# Patient Record
Sex: Male | Born: 1985 | Race: Black or African American | Hispanic: No | Marital: Single | Smoking: Never smoker
Health system: Southern US, Community
[De-identification: ages and names within clinical notes are randomized; demographics above are authoritative.]

## PROBLEM LIST (undated history)

## (undated) DIAGNOSIS — K5790 Diverticulosis of intestine, part unspecified, without perforation or abscess without bleeding: Secondary | ICD-10-CM

## (undated) DIAGNOSIS — K5792 Diverticulitis of intestine, part unspecified, without perforation or abscess without bleeding: Secondary | ICD-10-CM

## (undated) HISTORY — DX: Diverticulitis of intestine, part unspecified, without perforation or abscess without bleeding: K57.92

## (undated) HISTORY — DX: Diverticulosis of intestine, part unspecified, without perforation or abscess without bleeding: K57.90

---

## 2019-07-27 ENCOUNTER — Other Ambulatory Visit: Payer: Self-pay

## 2019-07-27 ENCOUNTER — Encounter (HOSPITAL_BASED_OUTPATIENT_CLINIC_OR_DEPARTMENT_OTHER): Payer: Self-pay | Admitting: *Deleted

## 2019-07-27 ENCOUNTER — Inpatient Hospital Stay (HOSPITAL_BASED_OUTPATIENT_CLINIC_OR_DEPARTMENT_OTHER)
Admission: EM | Admit: 2019-07-27 | Discharge: 2019-07-29 | DRG: 392 | Disposition: A | Discharge status: Home / Self Care | Payer: BC Managed Care – PPO | Source: Ambulatory Visit | Attending: Physician Assistant | Admitting: Physician Assistant

## 2019-07-27 ENCOUNTER — Emergency Department (HOSPITAL_BASED_OUTPATIENT_CLINIC_OR_DEPARTMENT_OTHER): Payer: BC Managed Care – PPO

## 2019-07-27 DIAGNOSIS — K5792 Diverticulitis of intestine, part unspecified, without perforation or abscess without bleeding: Secondary | ICD-10-CM | POA: Diagnosis present

## 2019-07-27 DIAGNOSIS — K8020 Calculus of gallbladder without cholecystitis without obstruction: Secondary | ICD-10-CM | POA: Diagnosis present

## 2019-07-27 DIAGNOSIS — K5732 Diverticulitis of large intestine without perforation or abscess without bleeding: Secondary | ICD-10-CM | POA: Diagnosis not present

## 2019-07-27 DIAGNOSIS — I493 Ventricular premature depolarization: Secondary | ICD-10-CM | POA: Diagnosis present

## 2019-07-27 DIAGNOSIS — Z20822 Contact with and (suspected) exposure to covid-19: Secondary | ICD-10-CM | POA: Diagnosis not present

## 2019-07-27 DIAGNOSIS — R1031 Right lower quadrant pain: Secondary | ICD-10-CM | POA: Diagnosis not present

## 2019-07-27 LAB — CBC WITH DIFFERENTIAL/PLATELET
Abs Immature Granulocytes: 0.03 10*3/uL (ref 0.00–0.07)
Basophils Absolute: 0 10*3/uL (ref 0.0–0.1)
Basophils Relative: 0 %
Eosinophils Absolute: 0 10*3/uL (ref 0.0–0.5)
Eosinophils Relative: 0 %
HCT: 44.1 % (ref 39.0–52.0)
Hemoglobin: 14.5 g/dL (ref 13.0–17.0)
Immature Granulocytes: 0 %
Lymphocytes Relative: 13 %
Lymphs Abs: 1.4 10*3/uL (ref 0.7–4.0)
MCH: 28.7 pg (ref 26.0–34.0)
MCHC: 32.9 g/dL (ref 30.0–36.0)
MCV: 87.3 fL (ref 80.0–100.0)
Monocytes Absolute: 0.9 10*3/uL (ref 0.1–1.0)
Monocytes Relative: 9 %
Neutro Abs: 8.4 10*3/uL — ABNORMAL HIGH (ref 1.7–7.7)
Neutrophils Relative %: 78 %
Platelets: 243 10*3/uL (ref 150–400)
RBC: 5.05 MIL/uL (ref 4.22–5.81)
RDW: 11.9 % (ref 11.5–15.5)
WBC: 10.8 10*3/uL — ABNORMAL HIGH (ref 4.0–10.5)
nRBC: 0 % (ref 0.0–0.2)

## 2019-07-27 LAB — LIPASE, BLOOD: Lipase: 21 U/L (ref 11–51)

## 2019-07-27 LAB — RESPIRATORY PANEL BY RT PCR (FLU A&B, COVID)
Influenza A by PCR: NEGATIVE
Influenza B by PCR: NEGATIVE
SARS Coronavirus 2 by RT PCR: NEGATIVE

## 2019-07-27 LAB — COMPREHENSIVE METABOLIC PANEL
ALT: 37 U/L (ref 0–44)
AST: 26 U/L (ref 15–41)
Albumin: 4.7 g/dL (ref 3.5–5.0)
Alkaline Phosphatase: 43 U/L (ref 38–126)
Anion gap: 11 (ref 5–15)
BUN: 9 mg/dL (ref 6–20)
CO2: 24 mmol/L (ref 22–32)
Calcium: 9.3 mg/dL (ref 8.9–10.3)
Chloride: 102 mmol/L (ref 98–111)
Creatinine, Ser: 0.92 mg/dL (ref 0.61–1.24)
GFR calc Af Amer: >60 mL/min (ref >60)
GFR calc non Af Amer: >60 mL/min (ref >60)
Glucose, Bld: 112 mg/dL — ABNORMAL HIGH (ref 70–99)
Potassium: 3.3 mmol/L — ABNORMAL LOW (ref 3.5–5.1)
Sodium: 137 mmol/L (ref 135–145)
Total Bilirubin: 2.6 mg/dL — ABNORMAL HIGH (ref 0.3–1.2)
Total Protein: 8 g/dL (ref 6.5–8.1)

## 2019-07-27 LAB — URINALYSIS, ROUTINE W REFLEX MICROSCOPIC
Bilirubin Urine: NEGATIVE
Glucose, UA: NEGATIVE mg/dL
Hgb urine dipstick: NEGATIVE
Ketones, ur: NEGATIVE mg/dL
Leukocytes,Ua: NEGATIVE
Nitrite: NEGATIVE
Protein, ur: NEGATIVE mg/dL
Specific Gravity, Urine: 1.02 (ref 1.005–1.030)
pH: 6 (ref 5.0–8.0)

## 2019-07-27 LAB — LACTIC ACID, PLASMA: Lactic Acid, Venous: 0.9 mmol/L (ref 0.5–1.9)

## 2019-07-27 MED ORDER — OXYCODONE HCL 5 MG PO TABS: 5.0000 mg | ORAL_TABLET | Freq: Four times a day (QID) | ORAL | Status: DC | PRN

## 2019-07-27 MED ORDER — ACETAMINOPHEN 500 MG PO TABS
1000.0000 mg | ORAL_TABLET | Freq: Four times a day (QID) | ORAL | Status: DC
  Administered 2019-07-27 – 2019-07-29 (×8): 1000 mg via ORAL
  Filled 2019-07-27 (×8): qty 2

## 2019-07-27 MED ORDER — MORPHINE SULFATE (PF) 4 MG/ML IV SOLN
4.0000 mg | Freq: Once | INTRAVENOUS | Status: AC
  Administered 2019-07-27: 4 mg via INTRAVENOUS
  Filled 2019-07-27: qty 1

## 2019-07-27 MED ORDER — ONDANSETRON 4 MG PO TBDP: 4.0000 mg | ORAL_TABLET | Freq: Four times a day (QID) | ORAL | Status: DC | PRN

## 2019-07-27 MED ORDER — DIPHENHYDRAMINE HCL 25 MG PO CAPS: 25.0000 mg | ORAL_CAPSULE | Freq: Four times a day (QID) | ORAL | Status: DC | PRN

## 2019-07-27 MED ORDER — KETOROLAC TROMETHAMINE 15 MG/ML IJ SOLN: 15.0000 mg | Freq: Four times a day (QID) | INTRAMUSCULAR | Status: DC | PRN

## 2019-07-27 MED ORDER — SODIUM CHLORIDE 0.9 % IV BOLUS (SEPSIS)
1000.0000 mL | Freq: Once | INTRAVENOUS | Status: AC
  Administered 2019-07-27: 1000 mL via INTRAVENOUS

## 2019-07-27 MED ORDER — HYDRALAZINE HCL 20 MG/ML IJ SOLN: 10.0000 mg | INTRAMUSCULAR | Status: DC | PRN

## 2019-07-27 MED ORDER — ENOXAPARIN SODIUM 40 MG/0.4ML ~~LOC~~ SOLN
40.0000 mg | SUBCUTANEOUS | Status: DC
  Administered 2019-07-27 – 2019-07-28 (×2): 40 mg via SUBCUTANEOUS
  Filled 2019-07-27 (×2): qty 0.4

## 2019-07-27 MED ORDER — IOHEXOL 300 MG/ML  SOLN
100.0000 mL | Freq: Once | INTRAMUSCULAR | Status: AC | PRN
  Administered 2019-07-27: 100 mL via INTRAVENOUS

## 2019-07-27 MED ORDER — SODIUM CHLORIDE 0.9 % IV SOLN: INTRAVENOUS | Status: DC

## 2019-07-27 MED ORDER — DIPHENHYDRAMINE HCL 50 MG/ML IJ SOLN: 25.0000 mg | Freq: Four times a day (QID) | INTRAMUSCULAR | Status: DC | PRN

## 2019-07-27 MED ORDER — PIPERACILLIN-TAZOBACTAM 3.375 G IVPB
3.3750 g | Freq: Three times a day (TID) | INTRAVENOUS | Status: DC
  Administered 2019-07-28 – 2019-07-29 (×4): 3.375 g via INTRAVENOUS
  Filled 2019-07-27 (×4): qty 50

## 2019-07-27 MED ORDER — IBUPROFEN 400 MG PO TABS: 600.0000 mg | ORAL_TABLET | Freq: Four times a day (QID) | ORAL | Status: DC | PRN

## 2019-07-27 MED ORDER — PIPERACILLIN-TAZOBACTAM 3.375 G IVPB
3.3750 g | Freq: Three times a day (TID) | INTRAVENOUS | Status: DC
  Administered 2019-07-27: 3.375 g via INTRAVENOUS
  Filled 2019-07-27: qty 50

## 2019-07-27 MED ORDER — SODIUM CHLORIDE 0.9 % IV BOLUS
1000.0000 mL | Freq: Once | INTRAVENOUS | Status: AC
  Administered 2019-07-27: 1000 mL via INTRAVENOUS

## 2019-07-27 MED ORDER — TRAMADOL HCL 50 MG PO TABS: 50.0000 mg | ORAL_TABLET | Freq: Four times a day (QID) | ORAL | Status: DC | PRN

## 2019-07-27 MED ORDER — PANTOPRAZOLE SODIUM 40 MG IV SOLR
40.0000 mg | Freq: Every day | INTRAVENOUS | Status: DC
  Administered 2019-07-28 (×2): 40 mg via INTRAVENOUS
  Filled 2019-07-27 (×2): qty 40

## 2019-07-27 MED ORDER — METHOCARBAMOL 500 MG PO TABS: 500.0000 mg | ORAL_TABLET | Freq: Four times a day (QID) | ORAL | Status: DC | PRN

## 2019-07-27 MED ORDER — METOPROLOL TARTRATE 5 MG/5ML IV SOLN: 5.0000 mg | Freq: Four times a day (QID) | INTRAVENOUS | Status: DC | PRN

## 2019-07-27 MED ORDER — ACETAMINOPHEN 325 MG PO TABS
650.0000 mg | ORAL_TABLET | Freq: Once | ORAL | Status: AC
  Administered 2019-07-27: 12:00:00 650 mg via ORAL
  Filled 2019-07-27: qty 2

## 2019-07-27 MED ORDER — HYDROMORPHONE HCL 1 MG/ML IJ SOLN: 0.5000 mg | INTRAMUSCULAR | Status: DC | PRN

## 2019-07-27 MED ORDER — PIPERACILLIN-TAZOBACTAM 3.375 G IVPB 30 MIN
3.3750 g | Freq: Once | INTRAVENOUS | Status: AC
  Administered 2019-07-27: 3.375 g via INTRAVENOUS
  Filled 2019-07-27 (×2): qty 50

## 2019-07-27 MED ORDER — ONDANSETRON HCL 4 MG/2ML IJ SOLN: 4.0000 mg | Freq: Four times a day (QID) | INTRAMUSCULAR | Status: DC | PRN

## 2019-07-27 NOTE — Plan of Care (Signed)

## 2019-07-27 NOTE — ED Notes (Signed)
Spoke with Ruby at [facility] regarding general surgery consult

## 2019-07-27 NOTE — H&P (Addendum)
Surgical H&P  Chief Complaint: Abdominal pain  HPI: Otherwise healthy 34 year old gentleman who presented to [facility] this afternoon with a complaint of lower abdominal pain.  This began yesterday afternoon and is localized to the lower abdomen on both sides, crampy in quality, moderate intensity and without any clear aggravating or alleviating factors.  Does not radiate.  He tried Gas-X which did not relieve his symptoms.  He has never had any symptoms like this.  He reports no significant history of constipation or irregular bowel habits.  No family history of GI problems to his knowledge.  Never had any nausea or vomiting associated with this.  No fevers or chills.  Last bowel movement was this morning and was normal.  On presentation he was tachycardic to the 120s but afebrile.  He was found to have a minimal leukocytosis of 10.8, and elevated bilirubin of 2.6 and otherwise normal labs.  Heart rate improved with pain medication and fluids.  CT scan performed which showed acute diverticulitis with probable contained tiny perforation, at the splenic flexure.  Surgical service was contacted for admission.  He works in Engineer, technical sales at [employer]  Non-smoker.  No Known Allergies  History reviewed. No pertinent past medical history.  History reviewed. No pertinent surgical history.  History reviewed. No pertinent family history.  Social History   Socioeconomic History  . Marital status: Single    Spouse name: Not on file  . Number of children: Not on file  . Years of education: Not on file  . Highest education level: Not on file  Occupational History  . Not on file  Tobacco Use  . Smoking status: Never Smoker  Substance and Sexual Activity  . Alcohol use: Not Currently  . Drug use: Never  . Sexual activity: Yes  Other Topics Concern  . Not on file  Social History Narrative  . Not on file   Social Determinants of Health   Financial Resource Strain:   .  Difficulty of Paying Living Expenses: Not on file  Food Insecurity:   . Worried About Charity fundraiser in the Last Year: Not on file  . Ran Out of Food in the Last Year: Not on file  Transportation Needs:   . Lack of Transportation (Medical): Not on file  . Lack of Transportation (Non-Medical): Not on file  Physical Activity:   . Days of Exercise per Week: Not on file  . Minutes of Exercise per Session: Not on file  Stress:   . Feeling of Stress : Not on file  Social Connections:   . Frequency of Communication with Friends and Family: Not on file  . Frequency of Social Gatherings with Friends and Family: Not on file  . Attends Religious Services: Not on file  . Active Member of Clubs or Organizations: Not on file  . Attends Archivist Meetings: Not on file  . Marital Status: Not on file    No current facility-administered medications on file prior to encounter.   Current Outpatient Medications on File Prior to Encounter  Medication Sig Dispense Refill  . ibuprofen (ADVIL) 200 MG tablet Take 200 mg by mouth every 6 (six) hours as needed for headache or moderate pain.    Marland Kitchen TADALAFIL PO Take 6-12 mg by mouth daily as needed (sexual activity).      Review of Systems: a complete, 10pt review of systems was completed with pertinent positives and negatives as documented in the HPI  Physical  Exam: Vitals:   07/27/19 1409 07/27/19 1519  BP: (!) 167/110 (!) 163/102  Pulse: (!) 112 (!) 109  Resp: (!) 27 17  Temp:  98.2 F (36.8 C)  SpO2: 99% 98%   Gen: A&Ox3, no distress  Eyes: lids and conjunctivae normal, no icterus. Pupils equally round and reactive to light.  Respiratory: supple without mass or thyromegaly Chest: respiratory effort is normal. No crepitus or tenderness on palpation of the chest. Breath sounds equal.  Cardiovascular: RRR with palpable distal pulses, no pedal edema Gastrointestinal: soft, nondistended, minimally tender in the left mid abdomen. No  mass, hepatomegaly or splenomegaly. No hernia. Muscoloskeletal: no clubbing or cyanosis of the fingers.  Strength is symmetrical throughout.  Range of motion of bilateral upper and lower extremities normal with pain, crepitation or contracture. Neuro: cranial nerves grossly intact.  Sensation intact to light touch diffusely. Psych: appropriate mood and affect, normal insight/judgment intact  Skin: warm and dry   CBC Latest Ref Rng & Units 07/27/2019  WBC 4.0 - 10.5 K/uL 10.8(H)  Hemoglobin 13.0 - 17.0 g/dL 33.8  Hematocrit 25.0 - 52.0 % 44.1  Platelets 150 - 400 K/uL 243    CMP Latest Ref Rng & Units 07/27/2019  Glucose 70 - 99 mg/dL 539(J)  BUN 6 - 20 mg/dL 9  Creatinine 6.73 - 4.19 mg/dL 3.79  Sodium 024 - 097 mmol/L 137  Potassium 3.5 - 5.1 mmol/L 3.3(L)  Chloride 98 - 111 mmol/L 102  CO2 22 - 32 mmol/L 24  Calcium 8.9 - 10.3 mg/dL 9.3  Total Protein 6.5 - 8.1 g/dL 8.0  Total Bilirubin 0.3 - 1.2 mg/dL 2.6(H)  Alkaline Phos 38 - 126 U/L 43  AST 15 - 41 U/L 26  ALT 0 - 44 U/L 37    No results found for: INR, PROTIME  Imaging: CT Abdomen Pelvis W Contrast  Result Date: 07/27/2019 CLINICAL DATA:  Generalized abdominal pain. EXAM: CT ABDOMEN AND PELVIS WITH CONTRAST TECHNIQUE: Multidetector CT imaging of the abdomen and pelvis was performed using the standard protocol following bolus administration of intravenous contrast. CONTRAST:  OMNIPAQUE IOHEXOL 300 MG/ML  SOLN COMPARISON:  None. FINDINGS: Lower chest: No acute abnormality. Hepatobiliary: Tiny hypodensity in the right hepatic lobe (series 2, image 28) is too small to fully characterize but likely represents a small cyst. There is a tiny gallstone in the gallbladder neck. No gallbladder wall thickening, or biliary dilatation. Pancreas: Unremarkable. No pancreatic ductal dilatation or surrounding inflammatory changes. Spleen: Normal in size without focal abnormality. Adrenals/Urinary Tract: Adrenal glands are unremarkable. No  renal calculi or hydronephrosis. Tiny hypodensities in the bilateral kidneys are too small to fully characterize but likely represent small cysts. Bladder is unremarkable. Stomach/Bowel: Stomach is within normal limits. Appendix appears normal. There are numerous colonic diverticula in the sigmoid and descending colon. There is bowel wall thickening and pericolonic fat stranding in the left upper quadrant at the junction between the transverse and descending colon. Probable tiny contained perforation measuring 1.2 cm (series 5, image 44). No distant free air. Vascular/Lymphatic: No significant vascular findings are present. There are some mildly enlarged likely reactive lymph nodes in the left upper quadrant for example a lymph node measuring 1.0 cm in short axis (series 2, image 43). Reproductive: Prostate is unremarkable. Other: No abdominal wall hernia or abnormality. Trace free fluid in the pelvis. Musculoskeletal: Small benign appearing lesion with sclerotic rim in the right iliac bone (series 2, image 77). IMPRESSION: 1. Acute diverticulitis at the splenic  flexure in the left upper quadrant with probable tiny contained perforation. No distant free air. Mildly enlarged likely reactive lymph nodes in the left central abdomen. 2. Cholelithiasis without evidence of cholecystitis. Electronically Signed   By: Emmaline Kluver M.D.   On: 07/27/2019 13:03     A/P: 34 year old male with acute diverticulitis at the splenic flexure with a probable tiny contained perforation.  Admit for bowel rest, fluid resuscitation, IV antibiotics.  Serial abdominal exams.  We discussed the etiology and anatomy of diverticulitis and possible spectrum of disease, emphasizing that his symptoms are anticipated to improve with medical treatment and would recommend a colonoscopy in about 6 weeks.  We also discussed there is a small possibility that the small perforation can evolve into an abscess or free perforation which may require  drain placement or emergency surgery which can require colostomy.  Questions welcomed and answered. Elevated Tbili- check LFTs in AM  ADMISSION STATUS: Observation  Patient Active Problem List   Diagnosis Date Noted  . Diverticulitis 07/27/2019       Phylliss Blakes, MD [facility] [place] [facility]  See AMION to contact appropriate on-call provider

## 2019-07-27 NOTE — ED Provider Notes (Addendum)
[facility] EMERGENCY DEPARTMENT Provider Note   CSN: [id number] Arrival date & time: 07/27/19  1056     History Chief Complaint  Patient presents with  . Abdominal Pain    Ricky Robles. is a 34 y.o. male otherwise healthy no daily medication use presents today from an urgent care for complaint of lower abdominal pain.  Patient reports that he was sent for "a CT scan".  Patient reports that yesterday afternoon he developed lower abdominal pain, he describes a bilateral lower abdominal pain crampy moderate intensity nonradiating no clear aggravating or alleviating factors.  He reports that he took Gas-X without relief of symptoms.  He denies history of similar in the past.  Denies headache, vision changes, neck pain, chest pain/shortness of breath, cough, upper abdominal pain, nausea/vomiting, diarrhea, constipation, blood in the stool, dysuria/hematuria, testicular pain/swelling, penile discharge, fall/injury, extremity swelling/color change or any additional concerns.  Of note patient reports last bowel movement was this morning and was "normal".  HPI     History reviewed. No pertinent past medical history.  Patient Active Problem List   Diagnosis Date Noted  . Diverticulitis 07/27/2019    History reviewed. No pertinent surgical history.     History reviewed. No pertinent family history.  Social History   Tobacco Use  . Smoking status: Never Smoker  Substance Use Topics  . Alcohol use: Not Currently  . Drug use: Never    Home Medications Prior to Admission medications   Not on File    Allergies    Patient has no known allergies.  Review of Systems   Review of Systems Ten systems are reviewed and are negative for acute change except as noted in the HPI  Physical Exam Updated Vital Signs BP (!) 167/110 (BP Location: Right Arm)   Pulse (!) 112   Temp 99.4 F (37.4 C) (Oral)   Resp (!) 27   Ht 6\' 1"  (1.854 m)   Wt 113.4 kg   SpO2 99%    BMI 32.98 kg/m   Physical Exam Constitutional:      General: He is not in acute distress.    Appearance: Normal appearance. He is well-developed. He is not ill-appearing or diaphoretic.  HENT:     Head: Normocephalic and atraumatic.     Right Ear: External ear normal.     Left Ear: External ear normal.     Nose: Nose normal.  Eyes:     General: Vision grossly intact. Gaze aligned appropriately.     Pupils: Pupils are equal, round, and reactive to light.  Neck:     Trachea: Trachea and phonation normal. No tracheal deviation.  Cardiovascular:     Rate and Rhythm: Regular rhythm. Tachycardia present.  Pulmonary:     Effort: Pulmonary effort is normal. No respiratory distress.  Abdominal:     General: There is no distension.     Palpations: Abdomen is soft.     Tenderness: There is abdominal tenderness in the right lower quadrant, suprapubic area and left lower quadrant. There is guarding. There is no rebound.  Genitourinary:    Comments: Deferred by patient Musculoskeletal:        General: Normal range of motion.     Cervical back: Normal range of motion.  Skin:    General: Skin is warm and dry.  Neurological:     Mental Status: He is alert.     GCS: GCS eye subscore is 4. GCS verbal subscore is 5. GCS motor subscore  is 6.     Comments: Speech is clear and goal oriented, follows commands Major Cranial nerves without deficit, no facial droop Moves extremities without ataxia, coordination intact  Psychiatric:        Behavior: Behavior normal.     ED Results / Procedures / Treatments   Labs (all labs ordered are listed, but only abnormal results are displayed) Labs Reviewed  CBC WITH DIFFERENTIAL/PLATELET - Abnormal; Notable for the following components:      Result Value   WBC 10.8 (*)    Neutro Abs 8.4 (*)    All other components within normal limits  COMPREHENSIVE METABOLIC PANEL - Abnormal; Notable for the following components:   Potassium 3.3 (*)    Glucose, Bld  112 (*)    Total Bilirubin 2.6 (*)    All other components within normal limits  RESPIRATORY PANEL BY RT PCR (FLU A&B, COVID)  CULTURE, BLOOD (ROUTINE X 2)  CULTURE, BLOOD (ROUTINE X 2)  LIPASE, BLOOD  URINALYSIS, ROUTINE W REFLEX MICROSCOPIC  LACTIC ACID, PLASMA  LACTIC ACID, PLASMA    EKG EKG Interpretation  Date/Time:  Sunday July 27 2019 11:55:09 EST Ventricular Rate:  116 PR Interval:    QRS Duration: 93 QT Interval:  301 QTC Calculation: 419 R Axis:   80 Text Interpretation: Sinus tachycardia Ventricular premature complex Borderline T abnormalities, inferior leads No prior ECG for comparison . No STEMI Confirmed by Theda Belfast ([id number]) on 07/27/2019 2:27:01 PM   Radiology CT Abdomen Pelvis W Contrast  Result Date: 07/27/2019 CLINICAL DATA:  Generalized abdominal pain. EXAM: CT ABDOMEN AND PELVIS WITH CONTRAST TECHNIQUE: Multidetector CT imaging of the abdomen and pelvis was performed using the standard protocol following bolus administration of intravenous contrast. CONTRAST:  OMNIPAQUE IOHEXOL 300 MG/ML  SOLN COMPARISON:  None. FINDINGS: Lower chest: No acute abnormality. Hepatobiliary: Tiny hypodensity in the right hepatic lobe (series 2, image 28) is too small to fully characterize but likely represents a small cyst. There is a tiny gallstone in the gallbladder neck. No gallbladder wall thickening, or biliary dilatation. Pancreas: Unremarkable. No pancreatic ductal dilatation or surrounding inflammatory changes. Spleen: Normal in size without focal abnormality. Adrenals/Urinary Tract: Adrenal glands are unremarkable. No renal calculi or hydronephrosis. Tiny hypodensities in the bilateral kidneys are too small to fully characterize but likely represent small cysts. Bladder is unremarkable. Stomach/Bowel: Stomach is within normal limits. Appendix appears normal. There are numerous colonic diverticula in the sigmoid and descending colon. There is bowel wall thickening and  pericolonic fat stranding in the left upper quadrant at the junction between the transverse and descending colon. Probable tiny contained perforation measuring 1.2 cm (series 5, image 44). No distant free air. Vascular/Lymphatic: No significant vascular findings are present. There are some mildly enlarged likely reactive lymph nodes in the left upper quadrant for example a lymph node measuring 1.0 cm in short axis (series 2, image 43). Reproductive: Prostate is unremarkable. Other: No abdominal wall hernia or abnormality. Trace free fluid in the pelvis. Musculoskeletal: Small benign appearing lesion with sclerotic rim in the right iliac bone (series 2, image 77). IMPRESSION: 1. Acute diverticulitis at the splenic flexure in the left upper quadrant with probable tiny contained perforation. No distant free air. Mildly enlarged likely reactive lymph nodes in the left central abdomen. 2. Cholelithiasis without evidence of cholecystitis. Electronically Signed   By: Emmaline Kluver M.D.   On: 07/27/2019 13:03    Procedures Procedures (including critical care time)  Medications Ordered in ED  Medications  acetaminophen (TYLENOL) tablet 650 mg (650 mg Oral Given 07/27/19 1144)  sodium chloride 0.9 % bolus 1,000 mL (0 mLs Intravenous Stopped 07/27/19 1241)  morphine 4 MG/ML injection 4 mg (4 mg Intravenous Given 07/27/19 1145)  iohexol (OMNIPAQUE) 300 MG/ML solution 100 mL (100 mLs Intravenous Contrast Given 07/27/19 1243)  sodium chloride 0.9 % bolus 1,000 mL (0 mLs Intravenous Stopped 07/27/19 1413)  piperacillin-tazobactam (ZOSYN) IVPB 3.375 g (0 g Intravenous Stopped 07/27/19 1358)    ED Course  I have reviewed the triage vital signs and the nursing notes.  Pertinent labs & imaging results that were available during my care of the patient were reviewed by me and considered in my medical decision making (see chart for details).  Clinical Course as of Jul 27 1427  Wynelle Link Jul 27, 2019  1339 Dr. Fredricka Bonine   [BM]     Clinical Course User Index [BM] Elizabeth Palau   MDM Rules/Calculators/A&P                     34 year old male otherwise healthy presents today from urgent care for CT scan of his abdomen and pelvis.  Developed lower abdominal pain yesterday and without other complaints.  He is tachycardic on arrival, no tachypnea, fever, hypotension or hypoxia on room air.  No antipyretics prior to arrival.  Overall he is well-appearing in no acute distress does not appear toxic.  Cranial nerves intact, lower abdomen is diffusely tender, voluntary guarding no rebound, negative Murphy's sign.  No urinary symptoms or GU complaints.  Normal bowel movement this morning reported by patient and no nausea or vomiting.  Plan to obtain CBC, CMP, lipase, urinalysis, lactic and CT abdomen pelvis.  Will give patient fluids and pain medication as well.  He currently does not meet SIRS/sepsis criteria.  Case discussed with Dr. Rush Landmark who agrees with plan of care. - Lab work reviewed, mild leukocytosis of 10.8 with left shift.  Patient does not meet SIRS/sepsis criteria at this time.  Bilirubin is 2.6, CMP otherwise unremarkable, LFTs normal limits.  Lactic is 0.9.  Covid/flu panel is negative.  Patient's heart rate has begun to improved with pain medication and fluid bolus, will give second fluid bolus.    On reevaluation patient resting comfortable and in no acute distress, he reports pain has improved and he is not requesting additional pain medication at this time. - CT abdomen pelvis:    IMPRESSION:  1. Acute diverticulitis at the splenic flexure in the left upper  quadrant with probable tiny contained perforation. No distant free  air. Mildly enlarged likely reactive lymph nodes in the left central  abdomen.  2. Cholelithiasis without evidence of cholecystitis.   I have personally reviewed CT abdomen pelvis and agree with radiologist interpretation.  IV Zosyn ordered. Blood cultures have been  added. - 1:39 PM: I discussed the case with general surgeon Dr. Doylene Canard who is asked for patient to be transferred to [facility] for evaluation.  Patient will be admitted to surgical service on a MedSurg floor.  Dr. Doylene Canard to place admission order set. - Patient reevaluated, updated on findings as above and states understanding.  Vital signs have improved he still mildly tachycardic with heart rate approximately 110 bpm in the room.  He is resting comfortably and in no acute distress. He reports his pain has improved and is not requiring any additional medication at this time.  He is agreeable to admission to [facility]  and states understanding of care plan.  He has no questions or concerns at this time.  Discussed case with Ruby from [facility] who is arranging transport.  Case discussed with Dr. Sherry Ruffing during this visit who agrees with plan of care and admission.  Note: Portions of this report may have been transcribed using voice recognition software. Every effort was made to ensure accuracy; however, inadvertent computerized transcription errors may still be present. Final Clinical Impression(s) / ED Diagnoses Final diagnoses:  Acute diverticulitis    Rx / DC Orders ED Discharge Orders    None       Gari Crown 07/27/19 1429    Deliah Boston, [place] 07/27/19 1454    Tegeler, Gwenyth Allegra, MD 07/27/19 [phone]

## 2019-07-27 NOTE — ED Triage Notes (Signed)
States he went to an urgent care today with c/o abdominal pain, ws sent to our ED for further evaluation and examination

## 2019-07-28 ENCOUNTER — Encounter (HOSPITAL_COMMUNITY): Payer: Self-pay

## 2019-07-28 DIAGNOSIS — I493 Ventricular premature depolarization: Secondary | ICD-10-CM | POA: Diagnosis present

## 2019-07-28 DIAGNOSIS — Z20822 Contact with and (suspected) exposure to covid-19: Secondary | ICD-10-CM | POA: Diagnosis present

## 2019-07-28 DIAGNOSIS — K8020 Calculus of gallbladder without cholecystitis without obstruction: Secondary | ICD-10-CM | POA: Diagnosis present

## 2019-07-28 DIAGNOSIS — K5732 Diverticulitis of large intestine without perforation or abscess without bleeding: Secondary | ICD-10-CM | POA: Diagnosis present

## 2019-07-28 DIAGNOSIS — R1031 Right lower quadrant pain: Secondary | ICD-10-CM | POA: Diagnosis present

## 2019-07-28 LAB — CBC
HCT: 39.7 % (ref 39.0–52.0)
Hemoglobin: 12.4 g/dL — ABNORMAL LOW (ref 13.0–17.0)
MCH: 27.9 pg (ref 26.0–34.0)
MCHC: 31.2 g/dL (ref 30.0–36.0)
MCV: 89.4 fL (ref 80.0–100.0)
Platelets: 200 10*3/uL (ref 150–400)
RBC: 4.44 MIL/uL (ref 4.22–5.81)
RDW: 11.9 % (ref 11.5–15.5)
WBC: 7.7 10*3/uL (ref 4.0–10.5)
nRBC: 0 % (ref 0.0–0.2)

## 2019-07-28 LAB — BASIC METABOLIC PANEL
Anion gap: 7 (ref 5–15)
BUN: 8 mg/dL (ref 6–20)
CO2: 26 mmol/L (ref 22–32)
Calcium: 8.8 mg/dL — ABNORMAL LOW (ref 8.9–10.3)
Chloride: 106 mmol/L (ref 98–111)
Creatinine, Ser: 0.92 mg/dL (ref 0.61–1.24)
GFR calc Af Amer: >60 mL/min (ref >60)
GFR calc non Af Amer: >60 mL/min (ref >60)
Glucose, Bld: 96 mg/dL (ref 70–99)
Potassium: 3.6 mmol/L (ref 3.5–5.1)
Sodium: 139 mmol/L (ref 135–145)

## 2019-07-28 LAB — HEPATIC FUNCTION PANEL
ALT: 41 U/L (ref 0–44)
AST: 24 U/L (ref 15–41)
Albumin: 3.8 g/dL (ref 3.5–5.0)
Alkaline Phosphatase: 37 U/L — ABNORMAL LOW (ref 38–126)
Bilirubin, Direct: 0.6 mg/dL — ABNORMAL HIGH (ref 0.0–0.2)
Indirect Bilirubin: 2.3 mg/dL — ABNORMAL HIGH (ref 0.3–0.9)
Total Bilirubin: 2.9 mg/dL — ABNORMAL HIGH (ref 0.3–1.2)
Total Protein: 7 g/dL (ref 6.5–8.1)

## 2019-07-28 LAB — MAGNESIUM: Magnesium: 2 mg/dL (ref 1.7–2.4)

## 2019-07-28 MED ORDER — SODIUM CHLORIDE 0.9 % IV SOLN
INTRAVENOUS | Status: DC | PRN
  Administered 2019-07-28: 250 mL via INTRAVENOUS

## 2019-07-28 NOTE — Progress Notes (Signed)
Diverticulitis  Subjective: Patient's pain is almost completely resolved.  He is passing flatus.  Objective: Vital signs in last 24 hours: Temp:  [97.8 F (36.6 C)-99.4 F (37.4 C)] 97.9 F (36.6 C) (02/15 0539) Pulse Rate:  [88-122] 91 (02/15 0539) Resp:  [15-28] 16 (02/15 0539) BP: (109-167)/(94-110) 109/94 (02/15 0539) SpO2:  [98 %-100 %] 100 % (02/15 0539) Weight:  [113.4 kg] 113.4 kg (02/14 1120) Last BM Date: 07/27/19  Intake/Output from previous day: 02/14 0701 - 02/15 0700 In: 1699.7 [I.V.:1699.7] Out: 455 [Urine:455] Intake/Output this shift: No intake/output data recorded.  General appearance: alert and cooperative GI: soft, nontender  Lab Results:  Results for orders placed or performed during the hospital encounter of 07/27/19 (from the past 24 hour(s))  CBC with Differential     Status: Abnormal   Collection Time: 07/27/19 11:38 AM  Result Value Ref Range   WBC 10.8 (H) 4.0 - 10.5 K/uL   RBC 5.05 4.22 - 5.81 MIL/uL   Hemoglobin 14.5 13.0 - 17.0 g/dL   HCT 50.5 39.7 - 67.3 %   MCV 87.3 80.0 - 100.0 fL   MCH 28.7 26.0 - 34.0 pg   MCHC 32.9 30.0 - 36.0 g/dL   RDW 41.9 37.9 - 02.4 %   Platelets 243 150 - 400 K/uL   nRBC 0.0 0.0 - 0.2 %   Neutrophils Relative % 78 %   Neutro Abs 8.4 (H) 1.7 - 7.7 K/uL   Lymphocytes Relative 13 %   Lymphs Abs 1.4 0.7 - 4.0 K/uL   Monocytes Relative 9 %   Monocytes Absolute 0.9 0.1 - 1.0 K/uL   Eosinophils Relative 0 %   Eosinophils Absolute 0.0 0.0 - 0.5 K/uL   Basophils Relative 0 %   Basophils Absolute 0.0 0.0 - 0.1 K/uL   Immature Granulocytes 0 %   Abs Immature Granulocytes 0.03 0.00 - 0.07 K/uL  Comprehensive metabolic panel     Status: Abnormal   Collection Time: 07/27/19 11:38 AM  Result Value Ref Range   Sodium 137 135 - 145 mmol/L   Potassium 3.3 (L) 3.5 - 5.1 mmol/L   Chloride 102 98 - 111 mmol/L   CO2 24 22 - 32 mmol/L   Glucose, Bld 112 (H) 70 - 99 mg/dL   BUN 9 6 - 20 mg/dL   Creatinine, Ser 0.97  0.61 - 1.24 mg/dL   Calcium 9.3 8.9 - 35.3 mg/dL   Total Protein 8.0 6.5 - 8.1 g/dL   Albumin 4.7 3.5 - 5.0 g/dL   AST 26 15 - 41 U/L   ALT 37 0 - 44 U/L   Alkaline Phosphatase 43 38 - 126 U/L   Total Bilirubin 2.6 (H) 0.3 - 1.2 mg/dL   GFR calc non Af Amer >60 >60 mL/min   GFR calc Af Amer >60 >60 mL/min   Anion gap 11 5 - 15  Lipase, blood     Status: None   Collection Time: 07/27/19 11:38 AM  Result Value Ref Range   Lipase 21 11 - 51 U/L  Respiratory Panel by RT PCR (Flu A&B, Covid) - Nasopharyngeal Swab     Status: None   Collection Time: 07/27/19 11:38 AM   Specimen: Nasopharyngeal Swab  Result Value Ref Range   SARS Coronavirus 2 by RT PCR NEGATIVE NEGATIVE   Influenza A by PCR NEGATIVE NEGATIVE   Influenza B by PCR NEGATIVE NEGATIVE  Lactic acid, plasma     Status: None   Collection Time: 07/27/19  11:38 AM  Result Value Ref Range   Lactic Acid, Venous 0.9 0.5 - 1.9 mmol/L  Urinalysis, Routine w reflex microscopic     Status: None   Collection Time: 07/27/19 12:00 PM  Result Value Ref Range   Color, Urine YELLOW YELLOW   APPearance CLEAR CLEAR   Specific Gravity, Urine 1.020 1.005 - 1.030   pH 6.0 5.0 - 8.0   Glucose, UA NEGATIVE NEGATIVE mg/dL   Hgb urine dipstick NEGATIVE NEGATIVE   Bilirubin Urine NEGATIVE NEGATIVE   Ketones, ur NEGATIVE NEGATIVE mg/dL   Protein, ur NEGATIVE NEGATIVE mg/dL   Nitrite NEGATIVE NEGATIVE   Leukocytes,Ua NEGATIVE NEGATIVE  CBC     Status: Abnormal   Collection Time: 07/28/19  3:17 AM  Result Value Ref Range   WBC 7.7 4.0 - 10.5 K/uL   RBC 4.44 4.22 - 5.81 MIL/uL   Hemoglobin 12.4 (L) 13.0 - 17.0 g/dL   HCT 39.7 39.0 - 52.0 %   MCV 89.4 80.0 - 100.0 fL   MCH 27.9 26.0 - 34.0 pg   MCHC 31.2 30.0 - 36.0 g/dL   RDW 11.9 11.5 - 15.5 %   Platelets 200 150 - 400 K/uL   nRBC 0.0 0.0 - 0.2 %  Magnesium     Status: None   Collection Time: 07/28/19  3:17 AM  Result Value Ref Range   Magnesium 2.0 1.7 - 2.4 mg/dL  Basic metabolic  panel     Status: Abnormal   Collection Time: 07/28/19  3:17 AM  Result Value Ref Range   Sodium 139 135 - 145 mmol/L   Potassium 3.6 3.5 - 5.1 mmol/L   Chloride 106 98 - 111 mmol/L   CO2 26 22 - 32 mmol/L   Glucose, Bld 96 70 - 99 mg/dL   BUN 8 6 - 20 mg/dL   Creatinine, Ser 0.92 0.61 - 1.24 mg/dL   Calcium 8.8 (L) 8.9 - 10.3 mg/dL   GFR calc non Af Amer >60 >60 mL/min   GFR calc Af Amer >60 >60 mL/min   Anion gap 7 5 - 15  Hepatic function panel     Status: Abnormal   Collection Time: 07/28/19  3:17 AM  Result Value Ref Range   Total Protein 7.0 6.5 - 8.1 g/dL   Albumin 3.8 3.5 - 5.0 g/dL   AST 24 15 - 41 U/L   ALT 41 0 - 44 U/L   Alkaline Phosphatase 37 (L) 38 - 126 U/L   Total Bilirubin 2.9 (H) 0.3 - 1.2 mg/dL   Bilirubin, Direct 0.6 (H) 0.0 - 0.2 mg/dL   Indirect Bilirubin 2.3 (H) 0.3 - 0.9 mg/dL     Studies/Results Radiology     MEDS, Scheduled . acetaminophen  1,000 mg Oral Q6H  . enoxaparin (LOVENOX) injection  40 mg Subcutaneous Q24H  . pantoprazole (PROTONIX) IV  40 mg Intravenous QHS     Assessment: Diverticulitis White blood cell count has normalized.  Pain improved   Plan: Continue IV antibiotics.  Start full liquid diet.  Ambulate as tolerated.  If he continues to improve, we will plan on oral antibiotics and possible discharge tomorrow.  LOS: 0 days    Rosario Adie, MD [facility], [place]    07/28/2019 8:40 AM

## 2019-07-28 NOTE — Discharge Instructions (Signed)

## 2019-07-29 ENCOUNTER — Encounter: Payer: Self-pay | Admitting: Gastroenterology

## 2019-07-29 MED ORDER — AMOXICILLIN-POT CLAVULANATE 875-125 MG PO TABS: 1.0000 | ORAL_TABLET | Freq: Two times a day (BID) | ORAL | 0 refills | Status: AC

## 2019-07-29 MED ORDER — ACETAMINOPHEN 500 MG PO TABS: 1000.0000 mg | ORAL_TABLET | Freq: Four times a day (QID) | ORAL | 0 refills | Status: AC

## 2019-07-29 MED ORDER — AMOXICILLIN-POT CLAVULANATE 875-125 MG PO TABS
1.0000 | ORAL_TABLET | Freq: Two times a day (BID) | ORAL | Status: DC
  Administered 2019-07-29: 1 via ORAL
  Filled 2019-07-29: qty 1

## 2019-07-29 MED ORDER — PANTOPRAZOLE SODIUM 40 MG PO TBEC: 40.0000 mg | DELAYED_RELEASE_TABLET | Freq: Every day | ORAL | Status: DC

## 2019-07-29 NOTE — Discharge Summary (Signed)
[facility] Discharge Summary   Patient ID: Ricky Robles. MRN: [id number] DOB/AGE: [birth year] 34 y.o.  Admit date: 07/27/2019 Discharge date: 07/29/2019  Admitting Diagnosis: Diverticulitis at the splenic flexure with a probable tiny contained perforation  Discharge Diagnosis Patient Active Problem List   Diagnosis Date Noted  . Diverticulitis 07/27/2019    Consultants None  Imaging: No results found.  Procedures None  Hospital Course:  Ricky Marl Seago. is a 34yo male who was transferred from [facility] to [facility] 2/14 with acute worsening lower abdominal pain.  Workup showed mild leukocytosis and CT scan with acute diverticulitis and probable contained tiny perforation at the splenic flexure.  Patient was admitted to the surgical service for IV antibiotics and bowel rest. Leukocytosis resolved and pain improved. Diet was advanced as tolerated.  Patient was transitioned to oral antibiotics. On 2/16 the patient was voiding well, tolerating diet, ambulating well, pain well controlled, vital signs stable and felt stable for discharge home. He will go home with ciprofloxacin and flagyl to complete a 14 day course of antibiotics, and be referred to gastroenterology for colonoscopy in 6-8 weeks. Patient will follow up as below and knows to call with questions or concerns.      Allergies as of 07/29/2019   No Known Allergies     Medication List    TAKE these medications   acetaminophen 500 MG tablet Commonly known as: TYLENOL Take 2 tablets (1,000 mg total) by mouth every 6 (six) hours.   amoxicillin-clavulanate 875-125 MG tablet Commonly known as: AUGMENTIN Take 1 tablet by mouth every 12 (twelve) hours for 12 days.   ibuprofen 200 MG tablet Commonly known as: ADVIL Take 200 mg by mouth every 6 (six) hours as needed for headache or moderate pain.   TADALAFIL PO Take 6-12 mg by mouth daily as needed (sexual activity).        Follow-up  Information    [facility]. Call.   Specialty: Gastroenterology Why: Their office will contact you with an appointment to set up colonoscopy Contact information: [address] [place] [address] [phone]       [facility], [place]. Call.   Specialty: General Surgery Why: call as needed, you do not have to schedule an appointment Contact information: [address] [place] [address] [phone]       Primary care physician Follow up.   Why: Recommend looking at the back of your insurance card and call the number to find a primary care provider in network with your insurance          Signed: Franne Forts, PA-C [facility] 07/29/2019, 1:17 PM Please see Amion for pager number during day hours 7:00am-4:30pm

## 2019-07-29 NOTE — Progress Notes (Signed)
[facility] Progress Note     Subjective: CC-  Feeling well this morning. Denies any abdominal pain. Tolerating full liquids. Denies n/v. Passing flatus, no BM.  ROS: See above, otherwise other systems negative   Objective: Vital signs in last 24 hours: Temp:  [97.8 F (36.6 C)-98.4 F (36.9 C)] 98 F (36.7 C) (02/16 0650) Pulse Rate:  [77-86] 77 (02/16 0650) Resp:  [16-18] 16 (02/16 0650) BP: (140-146)/(93-101) 146/101 (02/16 0650) SpO2:  [99 %-100 %] 100 % (02/16 0650) Last BM Date: 07/27/19  Intake/Output from previous day: 02/15 0701 - 02/16 0700 In: 2307.7 [P.O.:840; I.V.:1367.8; IV Piggyback:100] Out: 0  Intake/Output this shift: Total I/O In: 670.5 [P.O.:540; I.V.:105.6; IV Piggyback:25] Out: -   PE: Gen:  Alert, NAD, pleasant HEENT: EOM's intact, pupils equal and round Card:  RRR Pulm:  CTAB, no W/R/R, rate and effort normal Abd: Soft, NT/ND, +BS, no HSM, no hernia Ext:  no BUE/BLE edema, calves soft and nontender Psych: A&Ox4  Skin: no rashes noted, warm and dry  Lab Results:  Recent Labs    07/27/19 1138 07/28/19 0317  WBC 10.8* 7.7  HGB 14.5 12.4*  HCT 44.1 39.7  PLT 243 200   BMET Recent Labs    07/27/19 1138 07/28/19 0317  NA 137 139  K 3.3* 3.6  CL 102 106  CO2 24 26  GLUCOSE 112* 96  BUN 9 8  CREATININE 0.92 0.92  CALCIUM 9.3 8.8*   PT/INR No results for input(s): LABPROT, INR in the last 72 hours. CMP     Component Value Date/Time   NA 139 07/28/2019 0317   K 3.6 07/28/2019 0317   CL 106 07/28/2019 0317   CO2 26 07/28/2019 0317   GLUCOSE 96 07/28/2019 0317   BUN 8 07/28/2019 0317   CREATININE 0.92 07/28/2019 0317   CALCIUM 8.8 (L) 07/28/2019 0317   PROT 7.0 07/28/2019 0317   ALBUMIN 3.8 07/28/2019 0317   AST 24 07/28/2019 0317   ALT 41 07/28/2019 0317   ALKPHOS 37 (L) 07/28/2019 0317   BILITOT 2.9 (H) 07/28/2019 0317   GFRNONAA >60 07/28/2019 0317   GFRAA >60 07/28/2019 0317   Lipase     Component  Value Date/Time   LIPASE 21 07/27/2019 1138       Studies/Results: CT Abdomen Pelvis W Contrast  Result Date: 07/27/2019 CLINICAL DATA:  Generalized abdominal pain. EXAM: CT ABDOMEN AND PELVIS WITH CONTRAST TECHNIQUE: Multidetector CT imaging of the abdomen and pelvis was performed using the standard protocol following bolus administration of intravenous contrast. CONTRAST:  OMNIPAQUE IOHEXOL 300 MG/ML  SOLN COMPARISON:  None. FINDINGS: Lower chest: No acute abnormality. Hepatobiliary: Tiny hypodensity in the right hepatic lobe (series 2, image 28) is too small to fully characterize but likely represents a small cyst. There is a tiny gallstone in the gallbladder neck. No gallbladder wall thickening, or biliary dilatation. Pancreas: Unremarkable. No pancreatic ductal dilatation or surrounding inflammatory changes. Spleen: Normal in size without focal abnormality. Adrenals/Urinary Tract: Adrenal glands are unremarkable. No renal calculi or hydronephrosis. Tiny hypodensities in the bilateral kidneys are too small to fully characterize but likely represent small cysts. Bladder is unremarkable. Stomach/Bowel: Stomach is within normal limits. Appendix appears normal. There are numerous colonic diverticula in the sigmoid and descending colon. There is bowel wall thickening and pericolonic fat stranding in the left upper quadrant at the junction between the transverse and descending colon. Probable tiny contained perforation measuring 1.2 cm (series 5, image 44). No  distant free air. Vascular/Lymphatic: No significant vascular findings are present. There are some mildly enlarged likely reactive lymph nodes in the left upper quadrant for example a lymph node measuring 1.0 cm in short axis (series 2, image 43). Reproductive: Prostate is unremarkable. Other: No abdominal wall hernia or abnormality. Trace free fluid in the pelvis. Musculoskeletal: Small benign appearing lesion with sclerotic rim in the right  iliac bone (series 2, image 77). IMPRESSION: 1. Acute diverticulitis at the splenic flexure in the left upper quadrant with probable tiny contained perforation. No distant free air. Mildly enlarged likely reactive lymph nodes in the left central abdomen. 2. Cholelithiasis without evidence of cholecystitis. Electronically Signed   By: Audie Pinto M.D.   On: 07/27/2019 13:03    Anti-infectives: Anti-infectives (From admission, onward)   Start     Dose/Rate Route Frequency Ordered Stop   07/29/19 1015  amoxicillin-clavulanate (AUGMENTIN) 875-125 MG per tablet 1 tablet     1 tablet Oral Every 12 hours 07/29/19 1013     07/27/19 2200  piperacillin-tazobactam (ZOSYN) IVPB 3.375 g  Status:  Discontinued     3.375 g 12.5 mL/hr over 240 Minutes Intravenous Every 8 hours 07/27/19 1617 07/29/19 1013   07/27/19 1530  piperacillin-tazobactam (ZOSYN) IVPB 3.375 g  Status:  Discontinued     3.375 g 12.5 mL/hr over 240 Minutes Intravenous Every 8 hours 07/27/19 1518 07/27/19 1617   07/27/19 1330  piperacillin-tazobactam (ZOSYN) IVPB 3.375 g     3.375 g 100 mL/hr over 30 Minutes Intravenous  Once 07/27/19 1316 07/27/19 1358       Assessment/Plan  Acute diverticulitis - 1st bout - CT shows acute diverticulitis at the splenic flexure in the left upper quadrant with probable tiny contained perforation, no distant free air - never had a colonoscopy before - WBC normalized, afebrile  ID - zosyn 2/14>>2/16, augmentin 2/16>> FEN - d/c IVF, soft diet VTE - SCDs, lovenox Foley - none Follow up - GI  Plan - Advance to soft diet. Transition to oral antibiotics (augmentin). If tolerating diet and abx will plan for discharge later today. I will refer to gastroenterologist to arrange for colonoscopy in 6-8 weeks.   LOS: 1 day    Wellington Hampshire, [facility] 07/29/2019, 10:14 AM Please see Amion for pager number during day hours 7:00am-4:30pm

## 2019-08-02 LAB — CULTURE, BLOOD (ROUTINE X 2)
Culture: NO GROWTH
Culture: NO GROWTH
Special Requests: ADEQUATE
Special Requests: ADEQUATE

## 2019-08-26 ENCOUNTER — Other Ambulatory Visit: Payer: Self-pay

## 2019-08-26 ENCOUNTER — Encounter: Payer: Self-pay | Admitting: Gastroenterology

## 2019-08-26 ENCOUNTER — Ambulatory Visit: Payer: BC Managed Care – PPO | Admitting: Gastroenterology

## 2019-08-26 VITALS — BP 130/80 | HR 91 | Temp 97.4°F | Ht 73.0 in | Wt 254.0 lb

## 2019-08-26 DIAGNOSIS — K5792 Diverticulitis of intestine, part unspecified, without perforation or abscess without bleeding: Secondary | ICD-10-CM | POA: Diagnosis not present

## 2019-08-26 NOTE — Progress Notes (Signed)
[facility] Consult Note:  History: Ricky Robles. 08/26/2019   Reason for consult/chief complaint: [facility] (Dr. Romie Levee) -diverticulitis  Subjective  HPI: Hospitalization a month ago for acute sigmoid diverticulitis with contained microperforation.  Responded to IV antibiotics and bowel rest while on surgical service.  Had total 14-day course of ciprofloxacin and metronidazole.  Referred to [place] for consideration of colonoscopy.   Ricky Robles is a very pleasant 34 year old man who had a few days of slowly escalating abdominal pain leading up to the recent hospital stay.  He had not had a previous episode, and he has since completely recovered.  He had no rectal bleeding during that episode nor since then.  Currently denies abdominal pain, nausea or vomiting.  Appetite returned to normal and weight has been stable.  No family history of colorectal cancer.   ROS:  Review of Systems  Constitutional: Negative for appetite change and unexpected weight change.  HENT: Negative for mouth sores and voice change.   Eyes: Negative for pain and redness.  Respiratory: Negative for cough and shortness of breath.   Cardiovascular: Negative for chest pain and palpitations.  Genitourinary: Negative for dysuria and hematuria.  Musculoskeletal: Negative for arthralgias and myalgias.  Skin: Negative for pallor and rash.  Neurological: Negative for weakness and headaches.  Hematological: Negative for adenopathy.     Past Medical History: Past Medical History:  Diagnosis Date  . Diverticulitis   . Diverticulosis      Past Surgical History: History reviewed. No pertinent surgical history.  No prior surgery  Family History: Family History  Problem Relation Age of Onset  . Hypertension Mother     Social History: Social History   Socioeconomic History  . Marital status: Single    Spouse name: Not on file  . Number of children: Not on file  .  Years of education: Not on file  . Highest education level: Not on file  Occupational History  . Not on file  Tobacco Use  . Smoking status: Never Smoker  . Smokeless tobacco: Never Used  Substance and Sexual Activity  . Alcohol use: Not Currently  . Drug use: Never  . Sexual activity: Yes  Other Topics Concern  . Not on file  Social History Narrative  . Not on file   Social Determinants of Health   Financial Resource Strain:   . Difficulty of Paying Living Expenses:   Food Insecurity:   . Worried About Programme researcher, broadcasting/film/video in the Last Year:   . Barista in the Last Year:   Transportation Needs:   . Freight forwarder (Medical):   Marland Kitchen Lack of Transportation (Non-Medical):   Physical Activity:   . Days of Exercise per Week:   . Minutes of Exercise per Session:   Stress:   . Feeling of Stress :   Social Connections:   . Frequency of Communication with Friends and Family:   . Frequency of Social Gatherings with Friends and Family:   . Attends Religious Services:   . Active Member of Clubs or Organizations:   . 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  Lives in [place] but frequently travels to [place] to see his girlfriend.  Allergies: No Known Allergies  Outpatient Meds: Current Outpatient Medications  Medication Sig Dispense Refill  . acetaminophen (TYLENOL) 500 MG tablet Take 2 tablets (1,000 mg  total) by mouth every 6 (six) hours. 30 tablet 0  . TADALAFIL PO Take 6-12 mg by mouth daily as needed (sexual activity).     No current facility-administered medications for this visit.      ___________________________________________________________________ Objective   Exam:  BP 130/80   Pulse 91   Temp (!) 97.4 F (36.3 C)   Ht 6\' 1"  (1.854 m)   Wt 254 lb (115.2 kg)   BMI 33.51 kg/m    General: Well-appearing  Eyes: sclera anicteric, no  redness  ENT: oral mucosa moist without lesions, no cervical or supraclavicular lymphadenopathy  CV: RRR without murmur, S1/S2, no JVD, no peripheral edema  Resp: clear to auscultation bilaterally, normal RR and effort noted  GI: soft, no tenderness, with active bowel sounds. No guarding or palpable organomegaly noted.  Skin; warm and dry, no rash or jaundice noted  Neuro: awake, alert and oriented x 3. Normal gross motor function and fluent speech  Labs:  CBC Latest Ref Rng & Units 07/28/2019 07/27/2019  WBC 4.0 - 10.5 K/uL 7.7 10.8(H)  Hemoglobin 13.0 - 17.0 g/dL 12.4(L) 14.5  Hematocrit 39.0 - 52.0 % 39.7 44.1  Platelets 150 - 400 K/uL 200 243   CMP Latest Ref Rng & Units 07/28/2019 07/27/2019  Glucose 70 - 99 mg/dL 96 112(H)  BUN 6 - 20 mg/dL 8 9  Creatinine 0.61 - 1.24 mg/dL 0.92 0.92  Sodium 135 - 145 mmol/L 139 137  Potassium 3.5 - 5.1 mmol/L 3.6 3.3(L)  Chloride 98 - 111 mmol/L 106 102  CO2 22 - 32 mmol/L 26 24  Calcium 8.9 - 10.3 mg/dL 8.8(L) 9.3  Total Protein 6.5 - 8.1 g/dL 7.0 8.0  Total Bilirubin 0.3 - 1.2 mg/dL 2.9(H) 2.6(H)  Alkaline Phos 38 - 126 U/L 37(L) 43  AST 15 - 41 U/L 24 26  ALT 0 - 44 U/L 41 37     Radiologic Studies:  CLINICAL DATA:  Generalized abdominal pain.   EXAM: CT ABDOMEN AND PELVIS WITH CONTRAST   TECHNIQUE: Multidetector CT imaging of the abdomen and pelvis was performed using the standard protocol following bolus administration of intravenous contrast.   CONTRAST:  156mL OMNIPAQUE IOHEXOL 300 MG/ML  SOLN   COMPARISON:  None.   FINDINGS: Lower chest: No acute abnormality.   Hepatobiliary: Tiny hypodensity in the right hepatic lobe (series 2, image 28) is too small to fully characterize but likely represents a small cyst. There is a tiny gallstone in the gallbladder neck. No gallbladder wall thickening, or biliary dilatation.   Pancreas: Unremarkable. No pancreatic ductal dilatation or surrounding inflammatory changes.    Spleen: Normal in size without focal abnormality.   Adrenals/Urinary Tract: Adrenal glands are unremarkable. No renal calculi or hydronephrosis. Tiny hypodensities in the bilateral kidneys are too small to fully characterize but likely represent small cysts. Bladder is unremarkable.   Stomach/Bowel: Stomach is within normal limits. Appendix appears normal. There are numerous colonic diverticula in the sigmoid and descending colon. There is bowel wall thickening and pericolonic fat stranding in the left upper quadrant at the junction between the transverse and descending colon. Probable tiny contained perforation measuring 1.2 cm (series 5, image 44). No distant free air.   Vascular/Lymphatic: No significant vascular findings are present. There are some mildly enlarged likely reactive lymph nodes in the left upper quadrant for example a lymph node measuring 1.0 cm in short axis (series 2, image 43).   Reproductive: Prostate is unremarkable.   Other: No abdominal  wall hernia or abnormality. Trace free fluid in the pelvis.   Musculoskeletal: Small benign appearing lesion with sclerotic rim in the right iliac bone (series 2, image 77).   IMPRESSION: 1. Acute diverticulitis at the splenic flexure in the left upper quadrant with probable tiny contained perforation. No distant free air. Mildly enlarged likely reactive lymph nodes in the left central abdomen. 2. Cholelithiasis without evidence of cholecystitis.     Electronically Signed   By: Emmaline Kluver M.D.   On: 07/27/2019 13:03   Assessment: Encounter Diagnosis  Name Primary?  . Acute diverticulitis Yes    First episode of diverticulitis, required IV antibiotics in hospital stay with bowel rest, but he has since completely recovered. I do not feel he needs colonoscopy at this point.  He and I discussed diverticulitis, unclear nature of why some patients prone to get episodes of infection while others do not.  Old  dietary guidelines do not have evidence to support them. Colonoscopy may be warranted in some scenarios where there are imaging findings or signs/symptoms such as bleeding, persistent pain or weight loss that might suggest alternate diagnosis such as malignancy or IBD.  Colonoscopy is warranted if there is a consideration of elective surgery for recurrent episodes, but that does not appear to be the case here.  Plan:  He will continue with healthy diet and lifestyle and see me as needed.  Advised to have colon cancer screening at 43.  Thank you for the courtesy of this consult.  Please call me with any questions or concerns.  Charlie Pitter III  CC: Referring provider noted above

## 2019-08-26 NOTE — Patient Instructions (Signed)
If you are age 36 or older, your body mass index should be between 23-30. Your Body mass index is 33.51 kg/m. If this is out of the aforementioned range listed, please consider follow up with your Primary Care Provider.  If you are age 7 or younger, your body mass index should be between 19-25. Your Body mass index is 33.51 kg/m. If this is out of the aformentioned range listed, please consider follow up with your Primary Care Provider.   Follow up as needed.  It was a pleasure to see you today!  Dr. Myrtie Neither

## 2020-08-24 IMAGING — CT CT ABD-PELV W/ CM
2 of 4 series · 16 of 46 positions shown, 18 images · IV contrast (Omnipaque)
Comparison: None.

CLINICAL DATA: Generalized abdominal pain.

EXAM:
CT ABDOMEN AND PELVIS WITH CONTRAST
TECHNIQUE: Multidetector CT imaging of the abdomen and pelvis was performed
using the standard protocol following bolus administration of
intravenous contrast.
CONTRAST:  100mL OMNIPAQUE IOHEXOL 300 MG/ML  SOLN

[Series 2: axial st · axial · 0.94mm/px · z∈[-516,-26]mm · 13 of 108 slices shown, 15 images]
[im 5/108  soft-tissue]
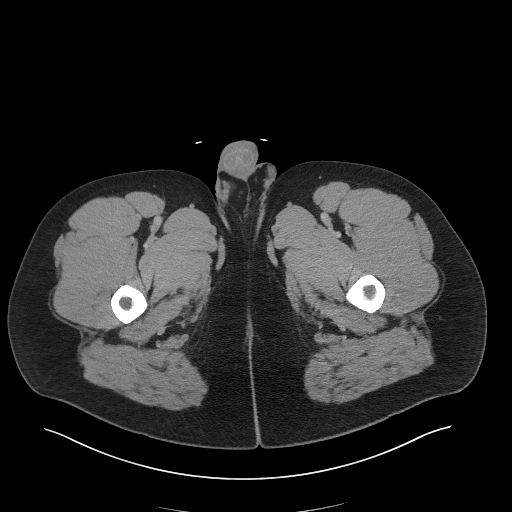
[im 5/108  bone]
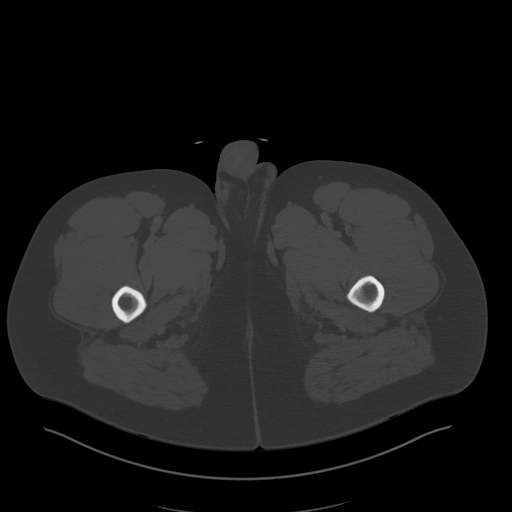
[im 14/108  soft-tissue]
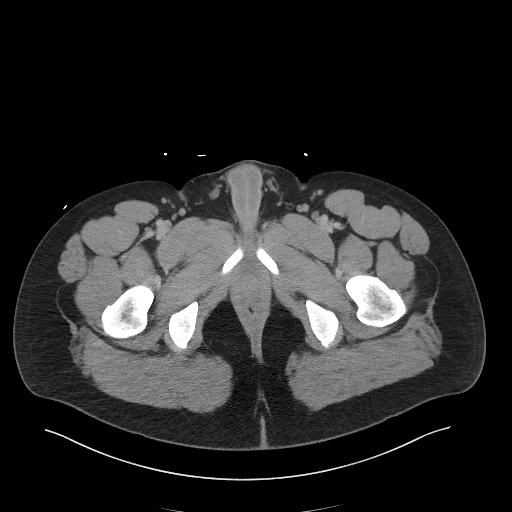
[im 24/108  soft-tissue]
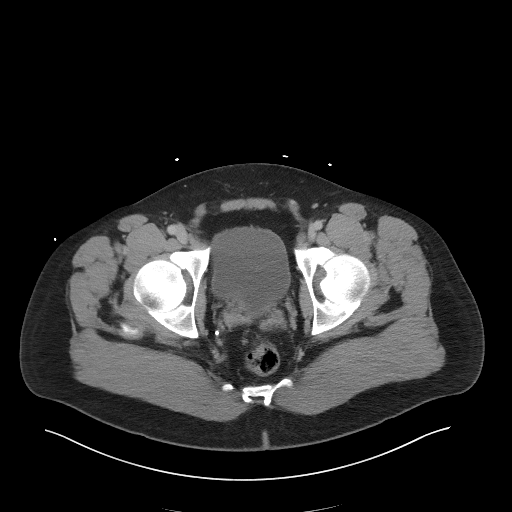
[im 28/108  soft-tissue]
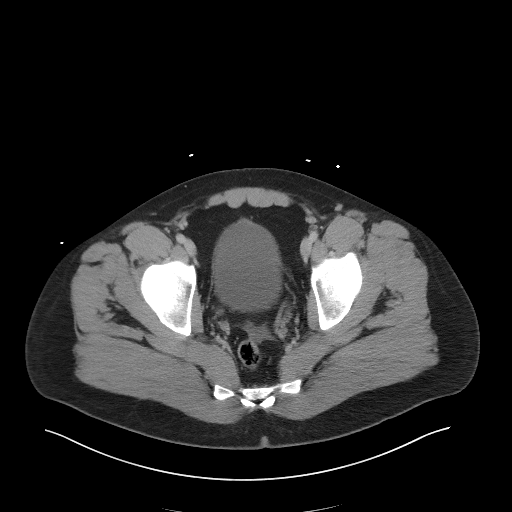
[im 38/108  soft-tissue]
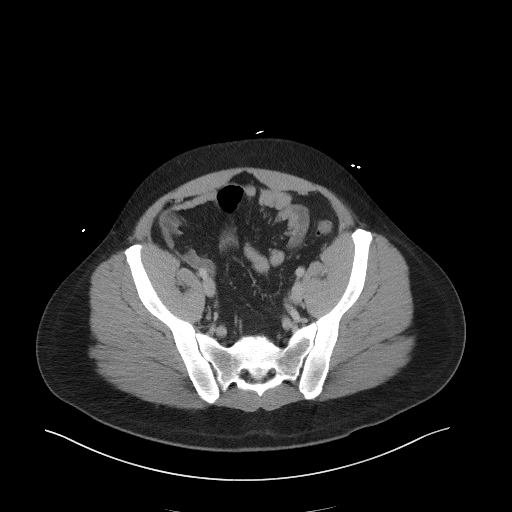
[im 47/108  soft-tissue]
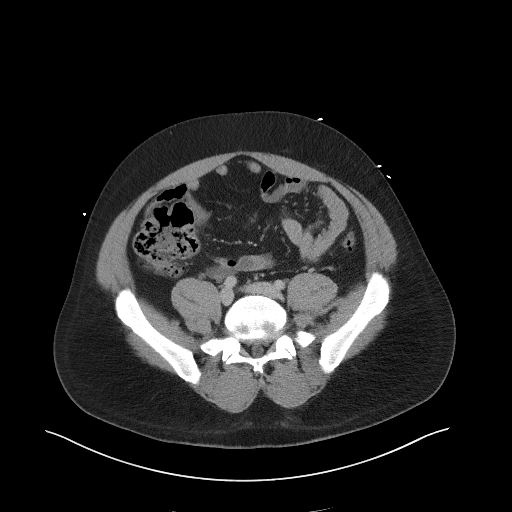
[im 56/108  soft-tissue]
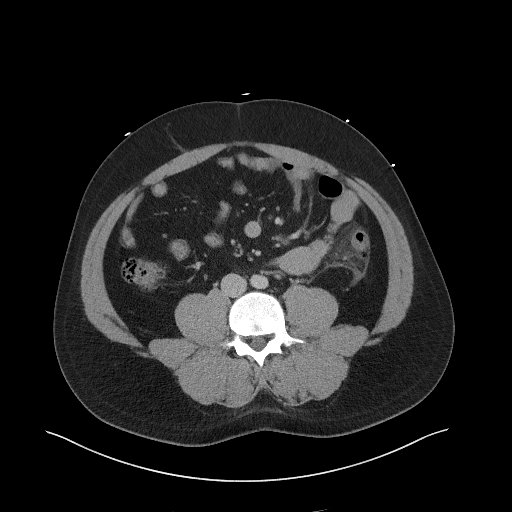
[im 61/108  soft-tissue]
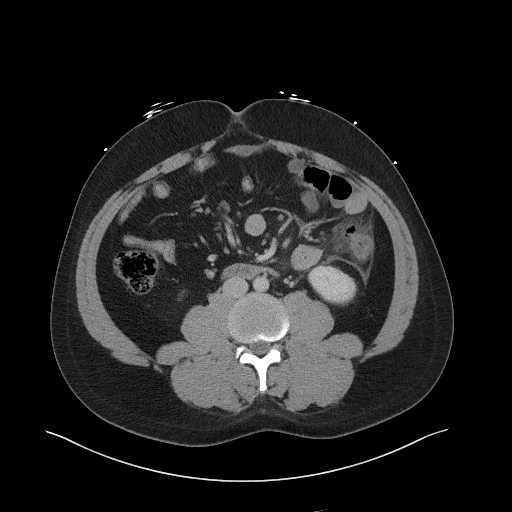
[im 70/108  soft-tissue]
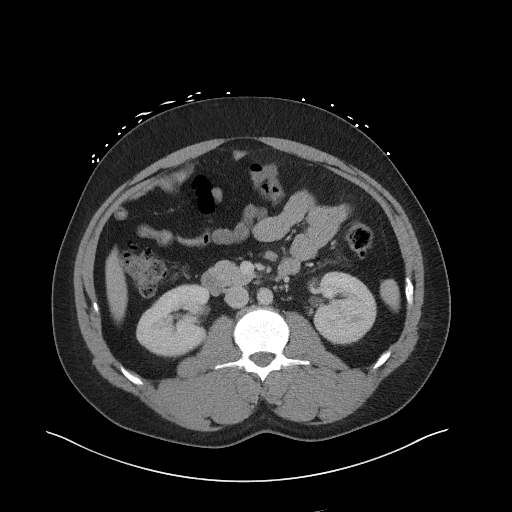
[im 70/108  bone]
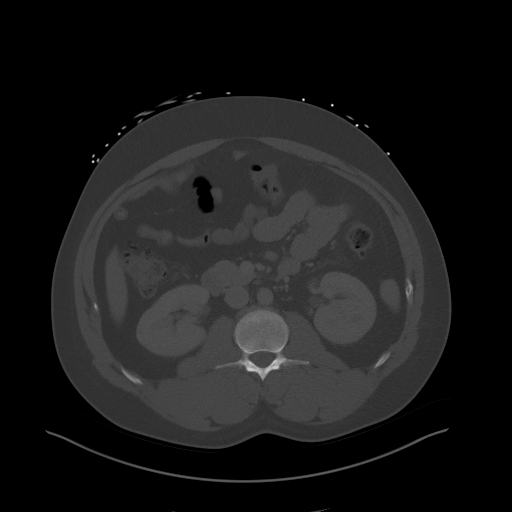
[im 80/108  soft-tissue]
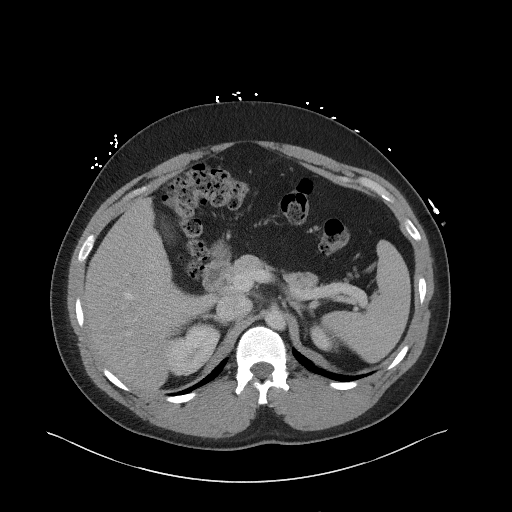
[im 84/108  soft-tissue]
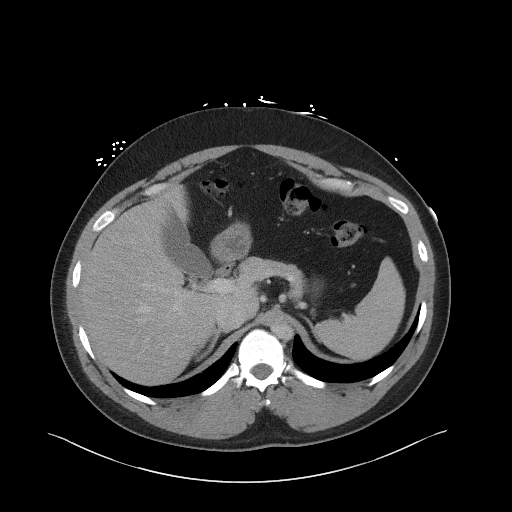
[im 94/108  soft-tissue]
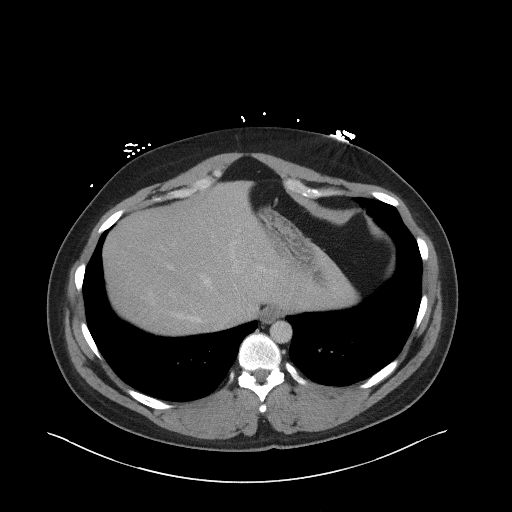
[im 103/108  soft-tissue]
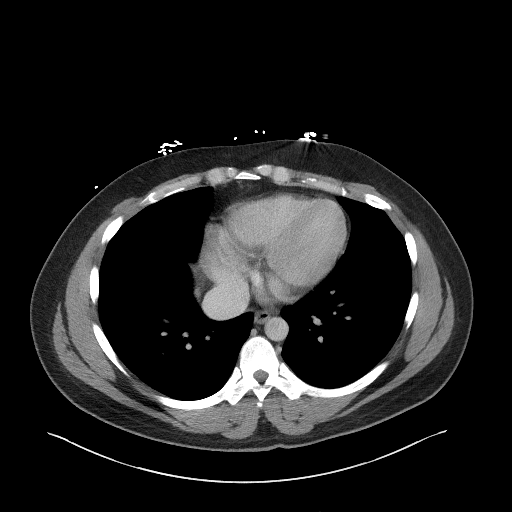

[Series 5: coronal st · coronal · 0.98mm/px · 3 of 110 slices shown]
[im 37/110  soft-tissue]
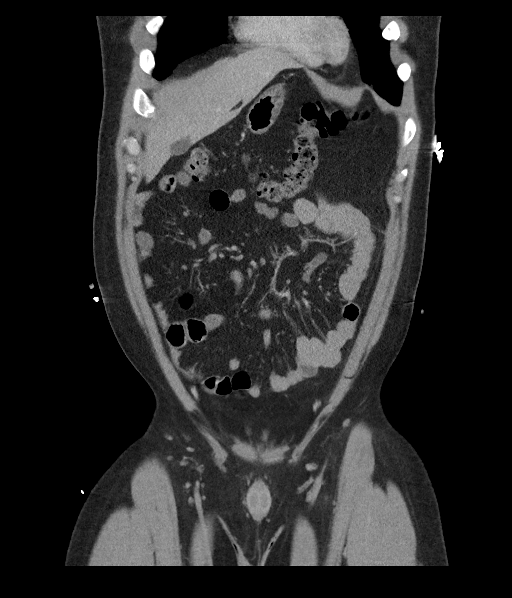
[im 49/110  soft-tissue]
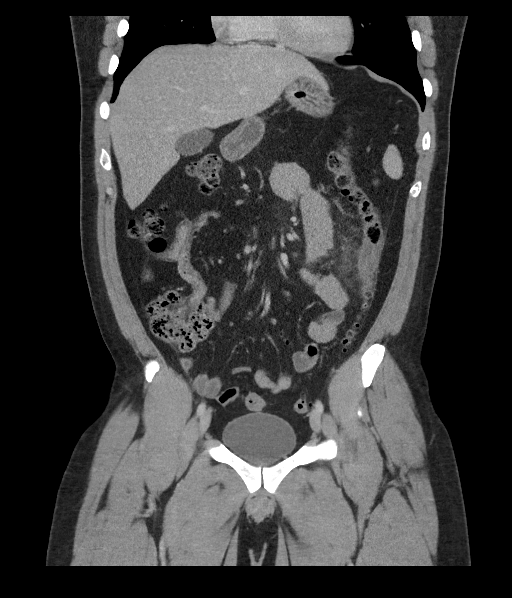
[im 61/110  soft-tissue]
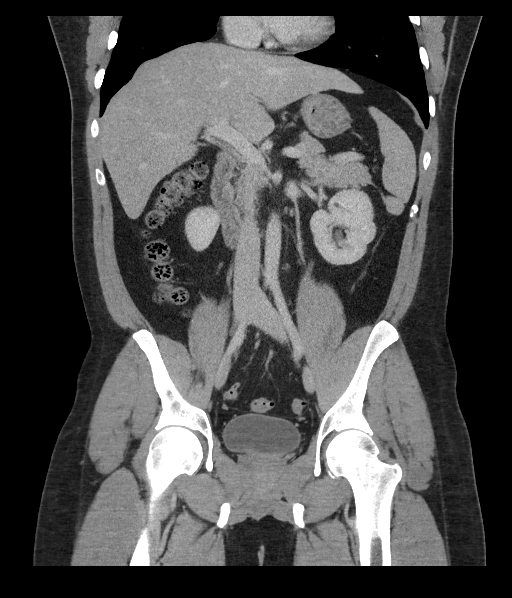

[16 of 46 positions shown; findings below may reference images not displayed]

FINDINGS: Lower chest: No acute abnormality.

Hepatobiliary: Tiny hypodensity in the right hepatic lobe (series 2,
image 28) is too small to fully characterize but likely represents a
small cyst. There is a tiny gallstone in the gallbladder neck. No
gallbladder wall thickening, or biliary dilatation.

Pancreas: Unremarkable. No pancreatic ductal dilatation or
surrounding inflammatory changes.

Spleen: Normal in size without focal abnormality.

Adrenals/Urinary Tract: Adrenal glands are unremarkable. No renal
calculi or hydronephrosis. Tiny hypodensities in the bilateral
kidneys are too small to fully characterize but likely represent
small cysts. Bladder is unremarkable.

Stomach/Bowel: Stomach is within normal limits. Appendix appears
normal. There are numerous colonic diverticula in the sigmoid and
descending colon. There is bowel wall thickening and pericolonic fat
stranding in the left upper quadrant at the junction between the
transverse and descending colon. Probable tiny contained perforation
measuring 1.2 cm (series 5, image 44). No distant free air.

Vascular/Lymphatic: No significant vascular findings are present.
There are some mildly enlarged likely reactive lymph nodes in the
left upper quadrant for example a lymph node measuring 1.0 cm in
short axis (series 2, image 43).

Reproductive: Prostate is unremarkable.

Other: No abdominal wall hernia or abnormality. Trace free fluid in
the pelvis.

Musculoskeletal: Small benign appearing lesion with sclerotic rim in
the right iliac bone (series 2, image 77).
IMPRESSION: 1. Acute diverticulitis at the splenic flexure in the left upper
quadrant with probable tiny contained perforation. No distant free
air. Mildly enlarged likely reactive lymph nodes in the left central
abdomen.
2. Cholelithiasis without evidence of cholecystitis.
